# Patient Record
Sex: Male | Born: 1975 | Race: Black or African American | Hispanic: No | Marital: Single | Smoking: Current every day smoker
Health system: Southern US, Community
[De-identification: ages and names within clinical notes are randomized; demographics above are authoritative.]

---

## 1997-10-16 ENCOUNTER — Emergency Department (HOSPITAL_COMMUNITY): Admission: EM | Admit: 1997-10-16 | Discharge: 1997-10-16 | Payer: Self-pay | Admitting: Emergency Medicine

## 2002-03-10 HISTORY — PX: SHOULDER SURGERY: SHX246

## 2005-08-01 ENCOUNTER — Emergency Department (HOSPITAL_COMMUNITY): Admission: EM | Admit: 2005-08-01 | Discharge: 2005-08-01 | Payer: Self-pay | Admitting: Emergency Medicine

## 2005-08-13 ENCOUNTER — Ambulatory Visit (HOSPITAL_COMMUNITY): Admission: RE | Admit: 2005-08-13 | Discharge: 2005-08-13 | Payer: Self-pay | Admitting: Urology

## 2005-09-18 ENCOUNTER — Inpatient Hospital Stay (HOSPITAL_COMMUNITY): Admission: RE | Admit: 2005-09-18 | Discharge: 2005-09-22 | Payer: Self-pay | Admitting: Urology

## 2011-03-15 ENCOUNTER — Encounter: Payer: Self-pay | Admitting: *Deleted

## 2011-03-15 ENCOUNTER — Emergency Department (HOSPITAL_COMMUNITY)
Admission: EM | Admit: 2011-03-15 | Discharge: 2011-03-17 | Discharge status: Against Medical Advice | Payer: Self-pay | Attending: Emergency Medicine | Admitting: Emergency Medicine

## 2011-03-15 DIAGNOSIS — M25519 Pain in unspecified shoulder: Secondary | ICD-10-CM | POA: Insufficient documentation

## 2011-03-15 NOTE — ED Notes (Signed)
Patient with history of right shoulder injury and it has been bothering him for a month and he is requesting cortisone shot

## 2011-03-15 NOTE — ED Notes (Signed)
Called back to CDU 9. No answer. Will try again in 10 min

## 2011-03-15 NOTE — ED Notes (Signed)
Called pt back again, no answer.

## 2011-03-15 NOTE — ED Notes (Signed)
Called patient name, not in waiting area, this was second time

## 2012-11-06 ENCOUNTER — Emergency Department (HOSPITAL_COMMUNITY)
Admission: EM | Admit: 2012-11-06 | Discharge: 2012-11-07 | Disposition: A | Discharge status: Home / Self Care | Payer: BC Managed Care – PPO | Attending: Emergency Medicine | Admitting: Emergency Medicine

## 2012-11-06 ENCOUNTER — Encounter (HOSPITAL_COMMUNITY): Payer: Self-pay | Admitting: Emergency Medicine

## 2012-11-06 DIAGNOSIS — Y929 Unspecified place or not applicable: Secondary | ICD-10-CM | POA: Insufficient documentation

## 2012-11-06 DIAGNOSIS — Y9389 Activity, other specified: Secondary | ICD-10-CM | POA: Insufficient documentation

## 2012-11-06 DIAGNOSIS — IMO0002 Reserved for concepts with insufficient information to code with codable children: Secondary | ICD-10-CM | POA: Insufficient documentation

## 2012-11-06 DIAGNOSIS — S99921A Unspecified injury of right foot, initial encounter: Secondary | ICD-10-CM

## 2012-11-06 DIAGNOSIS — F17200 Nicotine dependence, unspecified, uncomplicated: Secondary | ICD-10-CM | POA: Insufficient documentation

## 2012-11-06 DIAGNOSIS — S8990XA Unspecified injury of unspecified lower leg, initial encounter: Secondary | ICD-10-CM | POA: Insufficient documentation
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
## 2012-11-07 ENCOUNTER — Emergency Department (HOSPITAL_COMMUNITY): Payer: BC Managed Care – PPO

## 2012-11-07 MED ORDER — TRAMADOL HCL 50 MG PO TABS
50.0000 mg | ORAL_TABLET | Freq: Once | ORAL | Status: AC
  Administered 2012-11-07: 50 mg via ORAL
  Filled 2012-11-07: qty 1

## 2012-11-07 MED ORDER — NAPROXEN 500 MG PO TABS: 500.0000 mg | ORAL_TABLET | Freq: Two times a day (BID) | ORAL | Status: DC
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
## 2012-11-07 NOTE — ED Provider Notes (Signed)
Medical screening examination/treatment/procedure(s) were performed by non-physician practitioner and as supervising physician I was immediately available for consultation/collaboration.  Sunnie Nielsen, MD 11/07/12 [phone]

## 2012-11-07 NOTE — ED Notes (Signed)
Patient transported to X-ray 

## 2014-06-24 IMAGING — CR DG FOOT COMPLETE 3+V*R*
3 series · 3 of 3 positions shown · non-contrast
Comparison: None.

CLINICAL DATA: Foot injury with lateral pain.

RIGHT FOOT COMPLETE - 3+ VIEW

[x foot ap right]
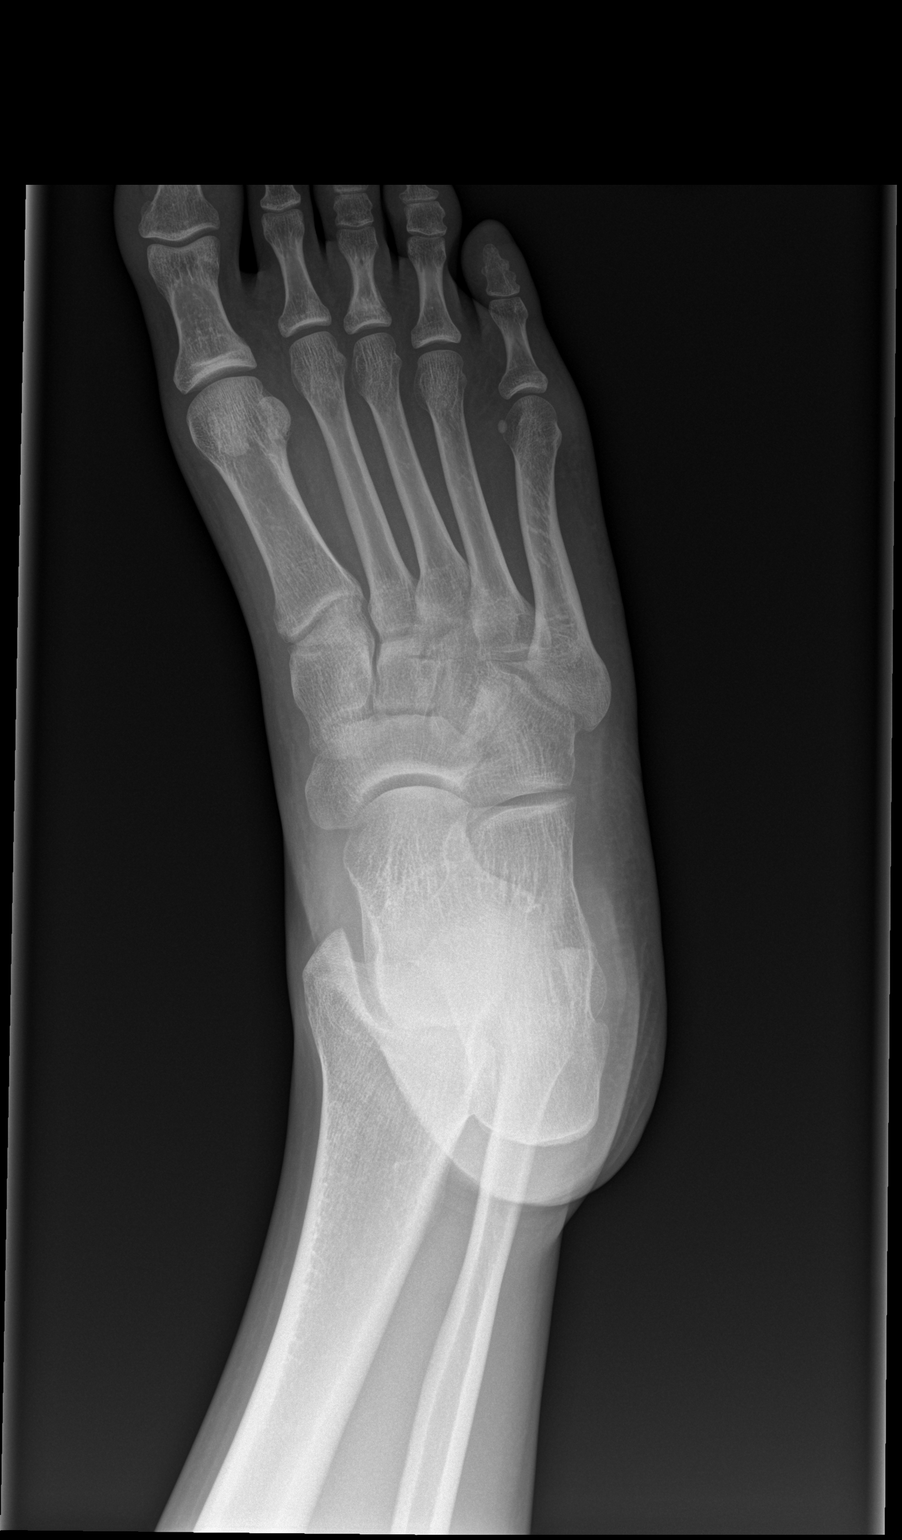

[x foot obl right]
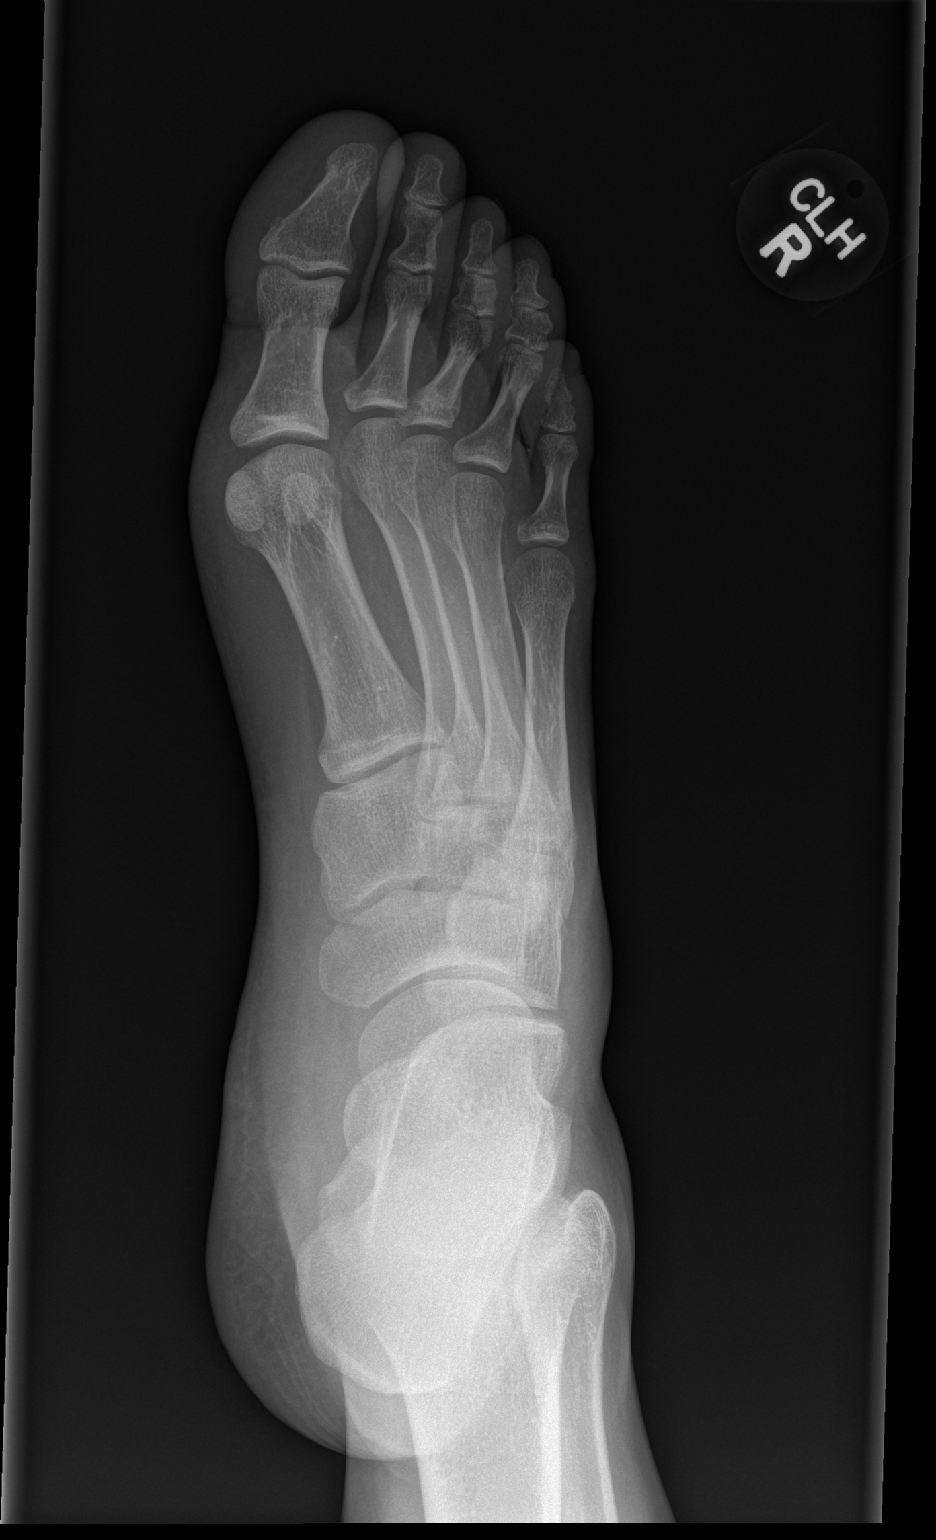

[x foot lat right]
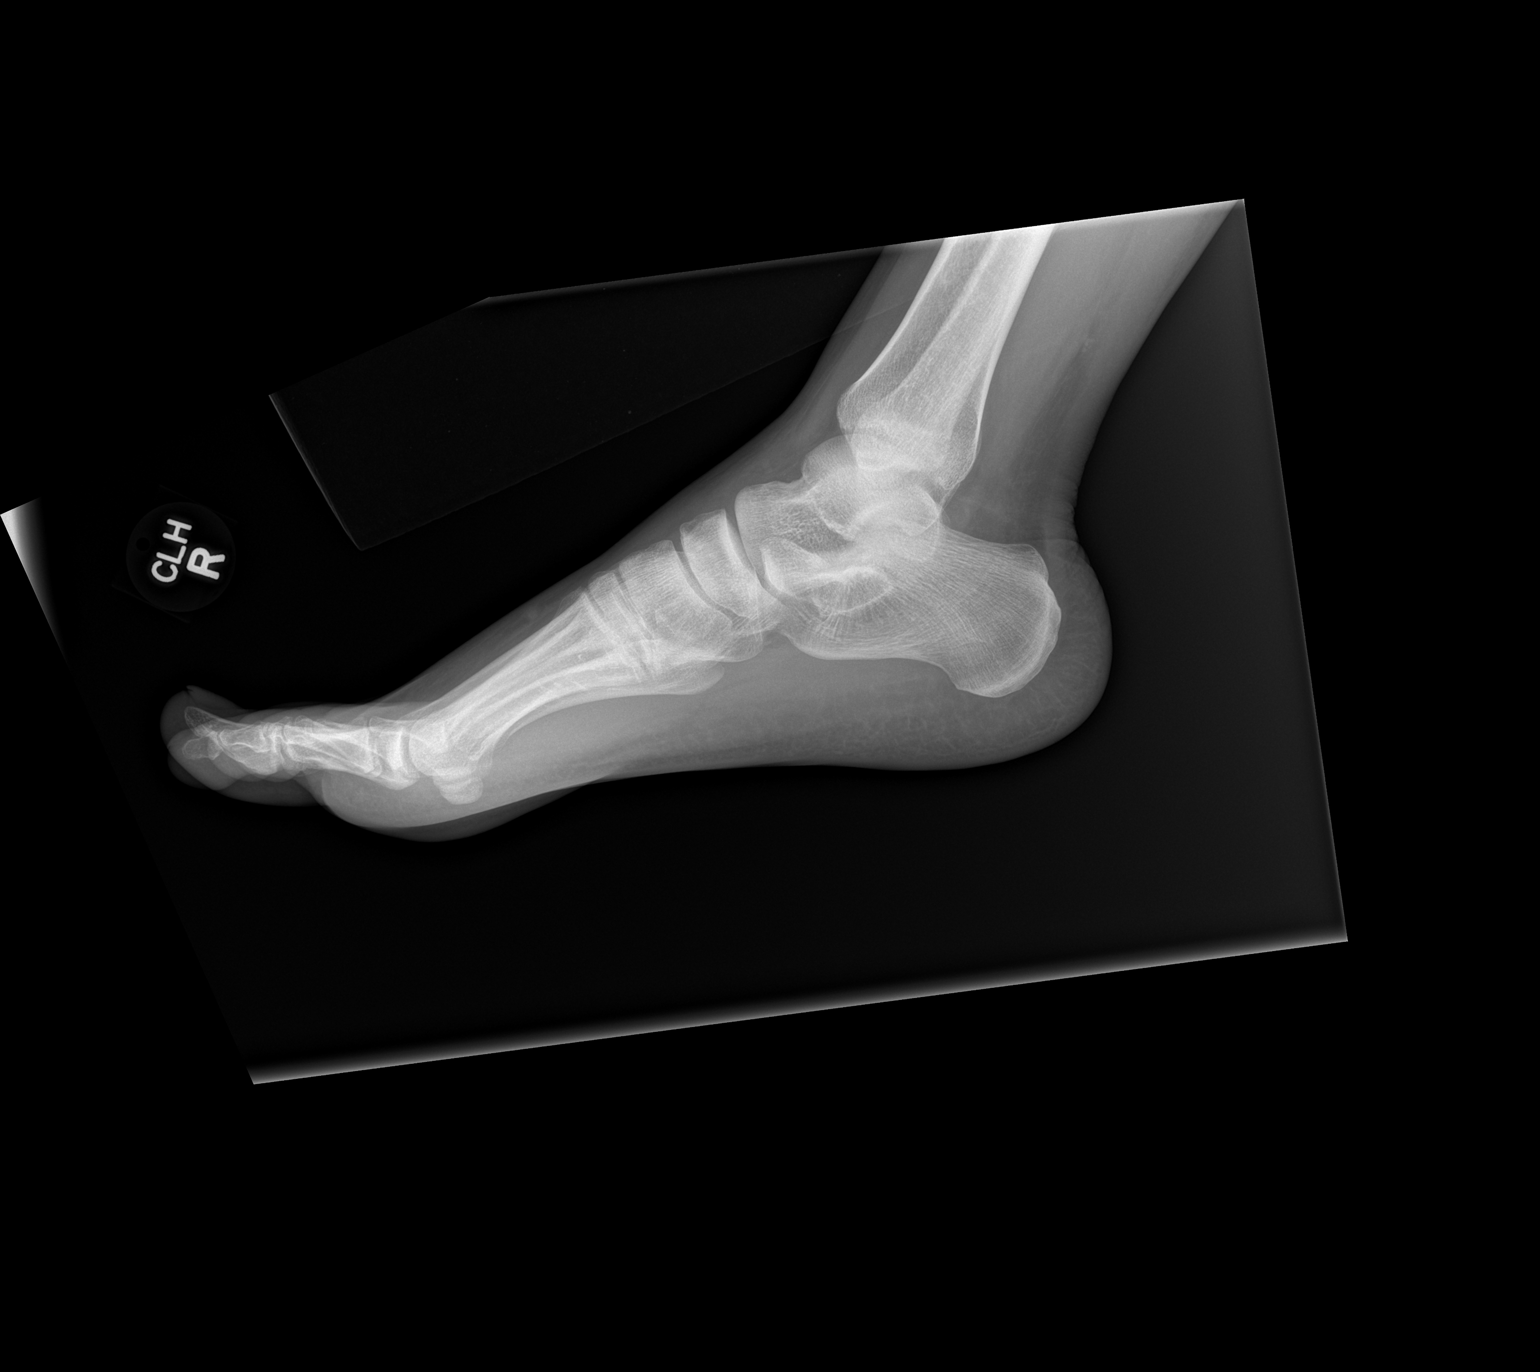

[3 of 3 positions shown; findings below may reference images not displayed]

FINDINGS: Remote appearing transverse, extra-articular fracture of
the proximal phalanx third digit.  No acute fracture detected.  No
malalignment.
IMPRESSION: 1. Negative for acute osseous injury.

2. Remote appearing fracture of the third digit proximal phalanx.

## 2014-08-19 ENCOUNTER — Encounter (HOSPITAL_BASED_OUTPATIENT_CLINIC_OR_DEPARTMENT_OTHER): Payer: Self-pay | Admitting: *Deleted

## 2014-08-19 ENCOUNTER — Emergency Department (HOSPITAL_BASED_OUTPATIENT_CLINIC_OR_DEPARTMENT_OTHER)
Admission: EM | Admit: 2014-08-19 | Discharge: 2014-08-19 | Disposition: A | Discharge status: Home / Self Care | Payer: BLUE CROSS/BLUE SHIELD | Attending: Emergency Medicine | Admitting: Emergency Medicine

## 2014-08-19 DIAGNOSIS — Y9389 Activity, other specified: Secondary | ICD-10-CM | POA: Insufficient documentation

## 2014-08-19 DIAGNOSIS — Y998 Other external cause status: Secondary | ICD-10-CM | POA: Diagnosis not present

## 2014-08-19 DIAGNOSIS — Y9289 Other specified places as the place of occurrence of the external cause: Secondary | ICD-10-CM | POA: Diagnosis not present

## 2014-08-19 DIAGNOSIS — S46912A Strain of unspecified muscle, fascia and tendon at shoulder and upper arm level, left arm, initial encounter: Secondary | ICD-10-CM | POA: Diagnosis not present

## 2014-08-19 DIAGNOSIS — S46812A Strain of other muscles, fascia and tendons at shoulder and upper arm level, left arm, initial encounter: Secondary | ICD-10-CM

## 2014-08-19 DIAGNOSIS — Z720 Tobacco use: Secondary | ICD-10-CM | POA: Insufficient documentation

## 2014-08-19 DIAGNOSIS — X58XXXA Exposure to other specified factors, initial encounter: Secondary | ICD-10-CM | POA: Diagnosis not present

## 2014-08-19 DIAGNOSIS — S4992XA Unspecified injury of left shoulder and upper arm, initial encounter: Secondary | ICD-10-CM | POA: Diagnosis present

## 2014-08-19 DIAGNOSIS — Z9889 Other specified postprocedural states: Secondary | ICD-10-CM | POA: Insufficient documentation

## 2014-08-19 MED ORDER — NAPROXEN 500 MG PO TABS: 500.0000 mg | ORAL_TABLET | Freq: Two times a day (BID) | ORAL | Status: AC

## 2014-08-19 MED ORDER — NAPROXEN 250 MG PO TABS
500.0000 mg | ORAL_TABLET | Freq: Once | ORAL | Status: AC
  Administered 2014-08-19: 500 mg via ORAL
  Filled 2014-08-19: qty 2

## 2014-08-19 MED ORDER — HYDROCODONE-ACETAMINOPHEN 5-325 MG PO TABS: 1.0000 | ORAL_TABLET | Freq: Four times a day (QID) | ORAL | Status: AC | PRN

## 2014-08-19 NOTE — ED Provider Notes (Signed)
CSN: [id number]     Arrival date & time 08/19/14  0055 History   First MD Initiated Contact with Patient 08/19/14 0146     Chief Complaint  Patient presents with  . Shoulder Pain     (Consider location/radiation/quality/duration/timing/severity/associated sxs/prior Treatment) HPI  Is a 39 year old male with about a four-month history of pain in his left shoulder. The pain is located medial to the left scapula in the upper part of the trapezius. The pain is been intermittent but worsened 2 days ago and has now been constant. He feels a knot at the site of the pain. The pain is sharp and sometimes radiates to the left side of his neck. It is not like previous rotator cuff pain. He has been taking over-the-counter medications without relief. Pain is mild to moderate presently but becomes moderate to severe when working.  History reviewed. No pertinent past medical history. Past Surgical History  Procedure Laterality Date  . Shoulder surgery  2004   History reviewed. No pertinent family history. History  Substance Use Topics  . Smoking status: Current Every Day Smoker -- 0.50 packs/day    Types: Cigarettes  . Smokeless tobacco: Not on file  . Alcohol Use: Yes    Review of Systems  All other systems reviewed and are negative.   Allergies  Review of patient's allergies indicates no known allergies.  Home Medications   Prior to Admission medications   Medication Sig Start Date End Date Taking? Authorizing Provider  ibuprofen (ADVIL,MOTRIN) 800 MG tablet Take 800 mg by mouth every 8 (eight) hours as needed.   Yes Historical Provider, MD  naproxen (NAPROSYN) 500 MG tablet Take 1 tablet (500 mg total) by mouth 2 (two) times daily. 11/07/12  Yes Heather Laisure, PA-C   BP 136/83 mmHg  Pulse 78  Temp(Src) 98.2 F (36.8 C) (Oral)  Resp 16  Ht 6' (1.829 m)  Wt 215 lb (97.523 kg)  BMI 29.15 kg/m2  SpO2 100%   Physical Exam  General: Well-developed, well-nourished male in no acute  distress; appearance consistent with age of record HENT: normocephalic; atraumatic Eyes: Normal appearance Neck: supple Heart: regular rate and rhythm Lungs: clear to auscultation bilaterally Abdomen: soft; nondistended; nontender Back: Mildly tender, swollen region of left trapezius without erythema or warmth Extremities: No deformity; full range of motion; pulses normal; no rotator cuff tenderness Neurologic: Awake, alert and oriented; motor function intact in all extremities and symmetric; no facial droop Skin: Warm and dry Psychiatric: Normal mood and affect    ED Course  Procedures (including critical care time)   MDM    Paula Libra, MD 08/19/14 [id number]

## 2014-08-19 NOTE — ED Notes (Signed)
C/o left shoulder pain x 2 days,  Does a lot of lifting and driving,  otc meds help for short time only

## 2014-08-19 NOTE — ED Notes (Signed)
Pt c/o left shoulder pain x 2 days.  °
